# Patient Record
Sex: Female | Born: 1970
Health system: Southern US, Community
[De-identification: ages and names within clinical notes are randomized; demographics above are authoritative.]

## PROBLEM LIST (undated history)

## (undated) DIAGNOSIS — G43909 Migraine, unspecified, not intractable, without status migrainosus: Secondary | ICD-10-CM

## (undated) DIAGNOSIS — L409 Psoriasis, unspecified: Secondary | ICD-10-CM

## (undated) DIAGNOSIS — N189 Chronic kidney disease, unspecified: Secondary | ICD-10-CM

## (undated) DIAGNOSIS — E785 Hyperlipidemia, unspecified: Secondary | ICD-10-CM

## (undated) HISTORY — DX: Psoriasis, unspecified: L40.9

## (undated) HISTORY — DX: Migraine, unspecified, not intractable, without status migrainosus: G43.909

## (undated) HISTORY — DX: Hyperlipidemia, unspecified: E78.5

## (undated) HISTORY — DX: Chronic kidney disease, unspecified: N18.9

---

## 1989-05-29 HISTORY — PX: PLACEMENT OF BREAST IMPLANTS: SHX6334

## 1998-05-29 HISTORY — PX: OTHER SURGICAL HISTORY: SHX169

## 1998-08-11 ENCOUNTER — Inpatient Hospital Stay (HOSPITAL_COMMUNITY): Admission: AD | Admit: 1998-08-11 | Discharge: 1998-08-11 | Payer: Self-pay | Admitting: Obstetrics and Gynecology

## 1998-08-20 ENCOUNTER — Other Ambulatory Visit: Admission: RE | Admit: 1998-08-20 | Discharge: 1998-08-20 | Payer: Self-pay | Admitting: Obstetrics and Gynecology

## 1998-11-10 ENCOUNTER — Encounter: Payer: Self-pay | Admitting: Obstetrics and Gynecology

## 1998-11-10 ENCOUNTER — Ambulatory Visit (HOSPITAL_COMMUNITY): Admission: RE | Admit: 1998-11-10 | Discharge: 1998-11-10 | Payer: Self-pay | Admitting: Obstetrics and Gynecology

## 1998-12-07 ENCOUNTER — Inpatient Hospital Stay (HOSPITAL_COMMUNITY): Admission: AD | Admit: 1998-12-07 | Discharge: 1998-12-07 | Payer: Self-pay | Admitting: Obstetrics and Gynecology

## 1999-02-26 ENCOUNTER — Inpatient Hospital Stay (HOSPITAL_COMMUNITY): Admission: AD | Admit: 1999-02-26 | Discharge: 1999-03-01 | Payer: Self-pay | Admitting: Obstetrics and Gynecology

## 1999-03-03 ENCOUNTER — Encounter (HOSPITAL_COMMUNITY): Admission: RE | Admit: 1999-03-03 | Discharge: 1999-06-01 | Payer: Self-pay | Admitting: Obstetrics and Gynecology

## 2002-01-10 ENCOUNTER — Encounter: Payer: Self-pay | Admitting: Nephrology

## 2002-01-10 ENCOUNTER — Encounter: Admission: RE | Admit: 2002-01-10 | Discharge: 2002-01-10 | Payer: Self-pay | Admitting: Nephrology

## 2002-04-01 ENCOUNTER — Ambulatory Visit (HOSPITAL_COMMUNITY): Admission: RE | Admit: 2002-04-01 | Discharge: 2002-04-01 | Payer: Self-pay | Admitting: Neurology

## 2002-04-01 ENCOUNTER — Encounter: Payer: Self-pay | Admitting: Neurology

## 2003-02-11 ENCOUNTER — Other Ambulatory Visit: Admission: RE | Admit: 2003-02-11 | Discharge: 2003-02-11 | Payer: Self-pay | Admitting: Obstetrics and Gynecology

## 2003-08-18 ENCOUNTER — Inpatient Hospital Stay (HOSPITAL_COMMUNITY): Admission: AD | Admit: 2003-08-18 | Discharge: 2003-08-22 | Payer: Self-pay | Admitting: Obstetrics and Gynecology

## 2003-08-23 ENCOUNTER — Encounter: Admission: RE | Admit: 2003-08-23 | Discharge: 2003-09-22 | Payer: Self-pay | Admitting: Obstetrics and Gynecology

## 2003-09-23 ENCOUNTER — Encounter: Admission: RE | Admit: 2003-09-23 | Discharge: 2003-10-23 | Payer: Self-pay | Admitting: Obstetrics and Gynecology

## 2003-10-07 ENCOUNTER — Other Ambulatory Visit: Admission: RE | Admit: 2003-10-07 | Discharge: 2003-10-07 | Payer: Self-pay | Admitting: Obstetrics and Gynecology

## 2011-02-04 ENCOUNTER — Inpatient Hospital Stay (INDEPENDENT_AMBULATORY_CARE_PROVIDER_SITE_OTHER)
Admission: RE | Admit: 2011-02-04 | Discharge: 2011-02-04 | Disposition: A | Discharge status: Home / Self Care | Payer: Commercial Managed Care - PPO | Source: Ambulatory Visit | Attending: Emergency Medicine | Admitting: Emergency Medicine

## 2011-02-04 ENCOUNTER — Encounter: Payer: Self-pay | Admitting: Emergency Medicine

## 2011-02-04 DIAGNOSIS — N390 Urinary tract infection, site not specified: Secondary | ICD-10-CM

## 2011-02-04 LAB — CONVERTED CEMR LAB
Bilirubin Urine: NEGATIVE
Glucose, Urine, Semiquant: NEGATIVE
Ketones, urine, test strip: NEGATIVE
Nitrite: NEGATIVE
Protein, U semiquant: NEGATIVE
Specific Gravity, Urine: <1.005
Urobilinogen, UA: 0.2
pH: 5.5

## 2011-02-07 ENCOUNTER — Telehealth (INDEPENDENT_AMBULATORY_CARE_PROVIDER_SITE_OTHER): Payer: Self-pay | Admitting: *Deleted

## 2011-05-01 NOTE — Progress Notes (Signed)
Summary: uti?/tm(RM4)   Vital Signs:  Patient Profile:   40 Years Old Female CC:      ? UTI Height:     67 inches Weight:      125.50 pounds O2 Sat:      100 % O2 treatment:    Room Air Temp:     98.6 degrees F oral Pulse rate:   79 / minute Resp:     18 per minute BP sitting:   122 / 86  (left arm) Cuff size:   regular  Vitals Entered By: Linton Flemings RN (February 04, 2011 9:51 AM)                  Updated Prior Medication List: No Medications Current Allergies: ! CODEINE PHOSPHATE (CODEINE PHOSPHATE)History of Present Illness Chief Complaint: ? UTI History of Present Illness: 40 Years Old Female complains of UTI symptoms for 1 days.  She describes the pain as burning during urination.  She has used Azo which is helping. + dysuria + frequency + urgency + hematuria No vaginal discharge No fever/chills No lower abdomenal pain No back pain No fatigue   REVIEW OF SYSTEMS Constitutional Symptoms       Complains of fever.     Denies chills, night sweats, weight loss, weight gain, and fatigue.  Eyes       Denies change in vision, eye pain, eye discharge, glasses, contact lenses, and eye surgery. Ear/Nose/Throat/Mouth       Denies hearing loss/aids, change in hearing, ear pain, ear discharge, dizziness, frequent runny nose, frequent nose bleeds, sinus problems, sore throat, hoarseness, and tooth pain or bleeding.  Respiratory       Denies dry cough, productive cough, wheezing, shortness of breath, asthma, bronchitis, and emphysema/COPD.  Cardiovascular       Denies murmurs, chest pain, and tires easily with exhertion.    Gastrointestinal       Denies stomach pain, nausea/vomiting, diarrhea, constipation, blood in bowel movements, and indigestion. Genitourniary       Complains of painful urination.      Denies kidney stones and loss of urinary control. Neurological       Denies paralysis, seizures, and fainting/blackouts. Musculoskeletal       Denies muscle  pain, joint pain, joint stiffness, decreased range of motion, redness, swelling, muscle weakness, and gout.  Skin       Denies bruising, unusual mles/lumps or sores, and hair/skin or nail changes.  Psych       Denies mood changes, temper/anger issues, anxiety/stress, speech problems, depression, and sleep problems. Other Comments: STARTED THIS MORNING   Past History:  Past Medical History: MIGRAINES  Social History: SMOKE-NO ALCOHOL-YES REC. DRUGS-NO Physical Exam General appearance: well developed, well nourished, no acute distress Abdomen: soft, non-tender without obvious organomegaly Back: no cva or flank tenderness MSE: oriented to time, place, and person Assessment New Problems: URINARY TRACT INFECTION (ICD-599.0)   Patient Education: Patient and/or caregiver instructed in the following: rest, fluids.  Plan New Medications/Changes: BACTRIM DS 800-160 MG TABS (SULFAMETHOXAZOLE-TRIMETHOPRIM) 1 by mouth two times a day for 5 days  #10 x 0, 02/04/2011, Hoyt Koch MD  New Orders: New Patient Level III [phone] T-Culture, Urine [[id number]] UA Dipstick w/o Micro (automated)  [81003] Planning Comments:   Urine culture pending Follow-up with your primary care physician if not improving or if getting worse   The patient and/or caregiver has been counseled thoroughly with regard to medications prescribed including dosage, schedule, interactions, rationale  for use, and possible side effects and they verbalize understanding.  Diagnoses and expected course of recovery discussed and will return if not improved as expected or if the condition worsens. Patient and/or caregiver verbalized understanding.  Prescriptions: BACTRIM DS 800-160 MG TABS (SULFAMETHOXAZOLE-TRIMETHOPRIM) 1 by mouth two times a day for 5 days  #10 x 0   Entered and Authorized by:   Hoyt Koch MD   Signed by:   Hoyt Koch MD on 02/04/2011   Method used:   Print then Give to Patient    RxID:   [id number]   Orders Added: 1)  New Patient Level III [14782] 2)  T-Culture, Urine [[id number]] 3)  UA Dipstick w/o Micro (automated)  [81003]    Laboratory Results   Urine Tests  Date/Time Received: February 04, 2011 10:07 AM  Date/Time Reported: February 04, 2011 10:07 AM   Routine Urinalysis   Color: lt. yellow Appearance: Clear Glucose: negative   (Normal Range: Negative) Bilirubin: negative   (Normal Range: Negative) Ketone: negative   (Normal Range: Negative) Spec. Gravity: <1.005   (Normal Range: 1.003-1.035) Blood: 3+   (Normal Range: Negative) pH: 5.5   (Normal Range: 5.0-8.0) Protein: negative   (Normal Range: Negative) Urobilinogen: 0.2   (Normal Range: 0-1) Nitrite: negative   (Normal Range: Negative) Leukocyte Esterace: 1+   (Normal Range: Negative)

## 2011-05-01 NOTE — Telephone Encounter (Signed)
  Phone Note Outgoing Call   Call placed by: Clemens Catholic LPN,  February 07, 2011 6:00 PM Summary of Call: call back: left message with culture results and to call back if she has any questions or concerns. Initial call taken by: Clemens Catholic LPN,  February 07, 2011 6:00 PM

## 2013-04-11 ENCOUNTER — Other Ambulatory Visit: Payer: Self-pay | Admitting: Obstetrics and Gynecology

## 2013-06-09 ENCOUNTER — Other Ambulatory Visit: Payer: Self-pay | Admitting: Obstetrics and Gynecology

## 2013-06-09 DIAGNOSIS — R928 Other abnormal and inconclusive findings on diagnostic imaging of breast: Secondary | ICD-10-CM

## 2013-06-17 ENCOUNTER — Ambulatory Visit
Admission: RE | Admit: 2013-06-17 | Discharge: 2013-06-17 | Disposition: A | Discharge status: Home / Self Care | Payer: 59 | Source: Ambulatory Visit | Attending: Obstetrics and Gynecology | Admitting: Obstetrics and Gynecology

## 2013-06-17 DIAGNOSIS — R928 Other abnormal and inconclusive findings on diagnostic imaging of breast: Secondary | ICD-10-CM

## 2014-11-05 IMAGING — MG MM DIANOSTIC UNILATERAL R
1 series · 1 of 1 positions shown · non-contrast
Comparison: Previous exams.

CLINICAL DATA: Screening recall for a possible right breast mass.

EXAM:
DIGITAL DIAGNOSTIC  RIGHT MAMMOGRAM
ULTRASOUND RIGHT BREAST

[R CC]
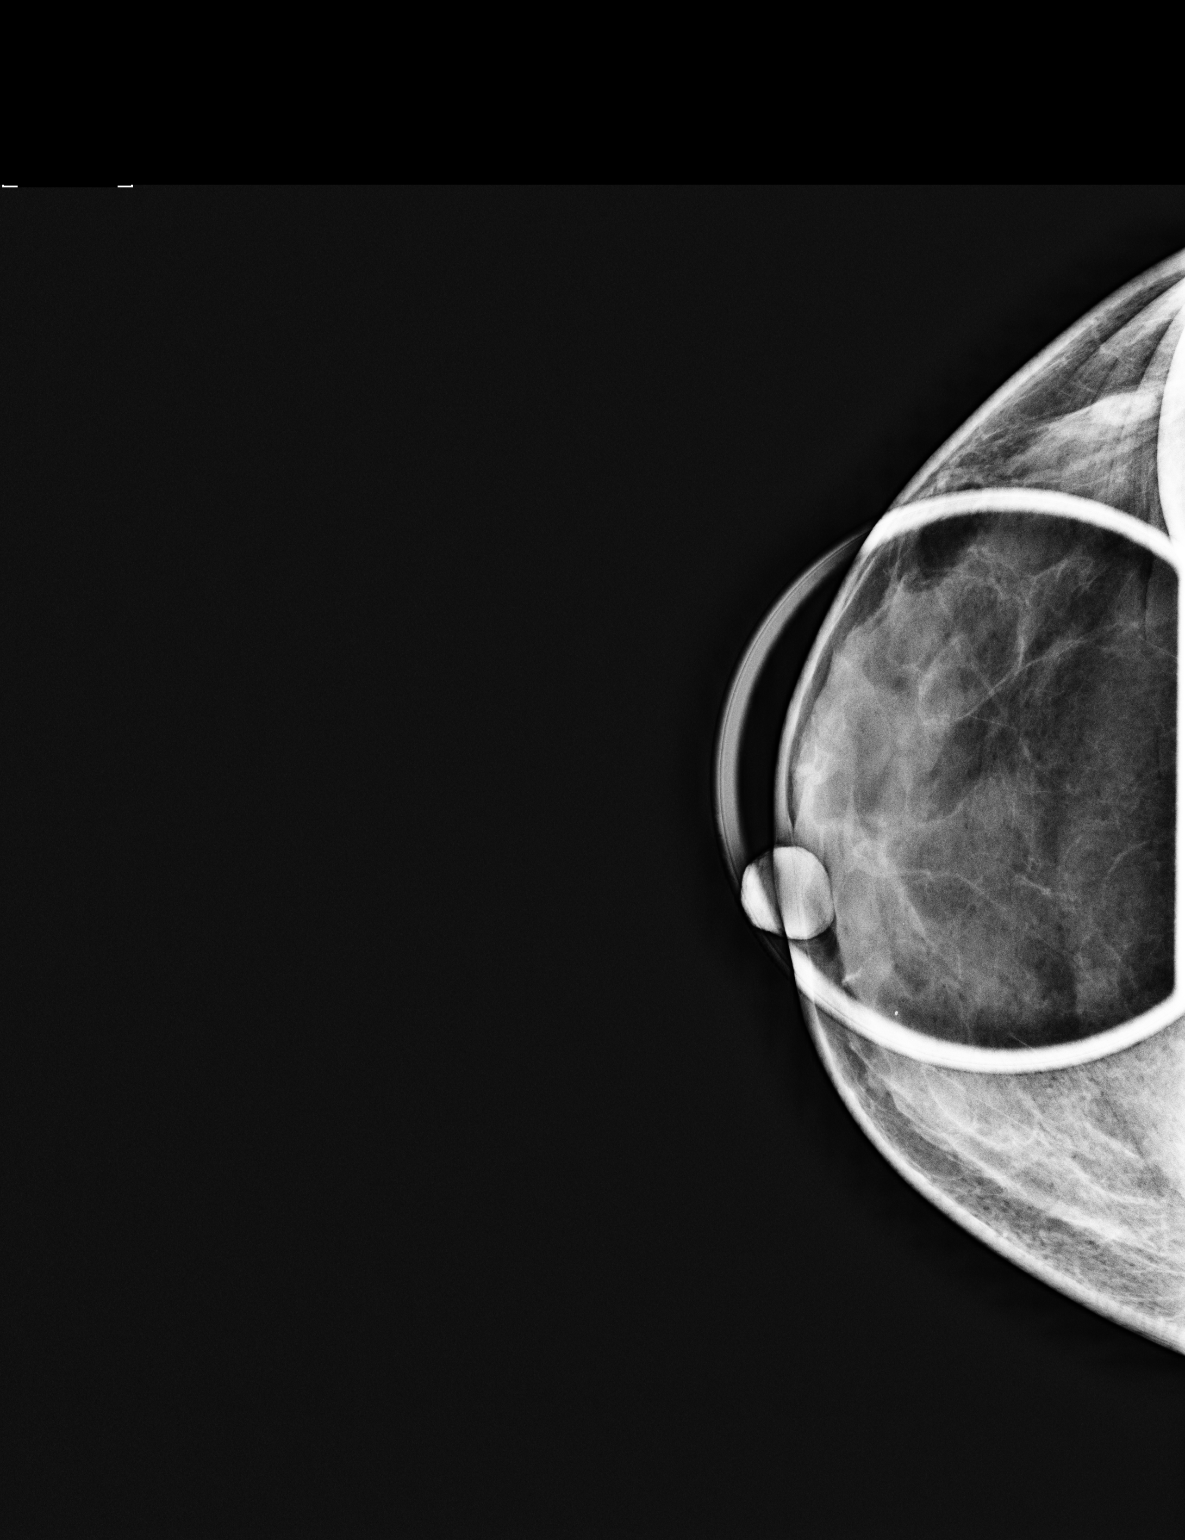

[1 of 1 positions shown; findings below may reference images not displayed]

ACR Breast Density Category c: The breast tissue is heterogeneously
dense, which may obscure small masses.
FINDINGS: The initially questioned possible mass in the periareolar/slightly
lateral right breast is no longer apparent on the additional implant
displaced spot compression CC and MLO views.

Physical examination of the periareolar/slightly lateral right
breast does not reveal any palpable masses.

Targeted ultrasound of the right breast was performed. No discrete
masses or abnormalities are seen, only normal appearing
fibroglandular tissue is visualized. The entire central and slightly
outer right breast was scanned.
IMPRESSION: Initially questioned possible right breast mass resolves on the
additional imaging with no sonographic correlate seen. Findings are
consistent with superimposed breast tissue. There is no mammographic
evidence of malignancy in the right breast.

RECOMMENDATION:
Screening mammogram in one year.(Code:Y3-1-43B)

I have discussed the findings and recommendations with the patient.
Results were also provided in writing at the conclusion of the
visit. If applicable, a reminder letter will be sent to the patient
regarding the next appointment.

BI-RADS CATEGORY  1: Negative

## 2016-12-14 ENCOUNTER — Ambulatory Visit (INDEPENDENT_AMBULATORY_CARE_PROVIDER_SITE_OTHER): Payer: 59 | Admitting: Family Medicine

## 2016-12-14 ENCOUNTER — Encounter: Payer: Self-pay | Admitting: Family Medicine

## 2016-12-14 VITALS — BP 133/89 | HR 93 | Ht 68.0 in | Wt 134.0 lb

## 2016-12-14 DIAGNOSIS — R1900 Intra-abdominal and pelvic swelling, mass and lump, unspecified site: Secondary | ICD-10-CM

## 2016-12-14 DIAGNOSIS — R110 Nausea: Secondary | ICD-10-CM | POA: Diagnosis not present

## 2016-12-14 DIAGNOSIS — G8929 Other chronic pain: Secondary | ICD-10-CM | POA: Diagnosis not present

## 2016-12-14 DIAGNOSIS — M545 Low back pain, unspecified: Secondary | ICD-10-CM | POA: Insufficient documentation

## 2016-12-14 NOTE — Assessment & Plan Note (Signed)
consistent with SI joint dysfunction.  Encouraged home exercises which were reviewed today.  Consider manipulation, injection in addition to therapy.  F/u prn for this issue.

## 2016-12-14 NOTE — Progress Notes (Addendum)
PCP: Patient, No Pcp Per  Subjective:   HPI: Patient is a 46 y.o. female here for nausea, abdominal pain.  Patient reports she's noticed intermittent nausea for about 2 years. Does not seem to be associated with abdominal pain, around eating or drinking. Happens almost every day. No diarrhea, constipation, melena, hematochezia, vomiting, heartburn, fever, chills, sweats, chest pain, shortness of breath. No bloating. Has had right sided low back pain for at least 10-15 years. Worse lying down, if trying to exercise. No radiation of pain. Also noticed recently strong pulsation in left side of abdomen. Unsure how long this has been there. Concerned because of strong FH of cardiac disease - mother had MIs in her 4s and men in family all tended to pass by their 84s of heart disease. She has no history of HTN, DM, HLD that she is aware of.  No past medical history on file.  No current outpatient prescriptions on file prior to visit.   No current facility-administered medications on file prior to visit.     No past surgical history on file.  Allergies  Allergen Reactions  . Codeine Phosphate     Social History   Social History  . Marital status: Single    Spouse name: N/A  . Number of children: N/A  . Years of education: N/A   Occupational History  . Not on file.   Social History Main Topics  . Smoking status: Never Smoker  . Smokeless tobacco: Never Used  . Alcohol use Not on file  . Drug use: Unknown  . Sexual activity: Not on file   Other Topics Concern  . Not on file   Social History Narrative  . No narrative on file    No family history on file.  BP 133/89   Pulse 93   Ht 5\' 8"  (1.727 m)   Wt 134 lb (60.8 kg)   BMI 20.37 kg/m   Review of Systems: See HPI above.     Objective:  Physical Exam:  Gen: NAD, comfortable in exam room, thin.  CV: RRR no MRG, strong radial pulses Lungs: CTAB without wheezes, rales, rhonchi. Abd: Pulsation noted  throughout abdomen but more prominent on left.  Soft, mild diffuse tenderness.  Nondistended.  No HSM and no palpable masses.    Back: No gross deformity, scoliosis. No TTP including SI joints.  No midline or bony TTP. FROM. Strength LEs 5/5 all muscle groups.   Negative SLRs. Sensation intact to light touch bilaterally. Negative logroll bilateral hips Positive right fabers with tightness - also tight on left but pain right SI on testing.  Negative piriformis.  Abd u/s:  Visualized abdominal aorta appears normal from just distal to ribcage to level of ASIS.  Largest diameter 1.5cm.  No evidence aneurysm.  Noted to be only about 2cm deep from abdominal wall.  Assessment & Plan:  1. Pulsatile mass - Abdominal aorta appears normal, reassured patient.  Family history would be her only risk factor known.  Likely appears more prominent because she is thin.  She has not had bloodwork in a long time - recommended fasting labs here (CMP, CBC with diff, lipids, lipase).  Of note she has also had intermittent nausea for about 2 years - we discussed consideration of CT abd/pelvis without contrast, RUQ u/s.  She will think about these in addition to above labwork - rest of ROS otherwise normal.  2. Low back pain - consistent with SI joint dysfunction.  Encouraged home exercises which  were reviewed today.  Consider manipulation, injection in addition to therapy.  F/u prn for this issue.  Addendum:  Lab results reviewed and discussed with patient - no abnormalities noted.  Her cholesterol is 210, LDL 140, HDL 52.  We discussed some basic dietary measures, encouraged exercise.  She could speak with her OB/Gyn on if she prescribes statins but based on her 10 year risk of CVD/CAD (1.3%) she does not meet indications for statin therapy.

## 2016-12-14 NOTE — Assessment & Plan Note (Signed)
Abdominal aorta appears normal, reassured patient.  Family history would be her only risk factor known.  Likely appears more prominent because she is thin.  She has not had bloodwork in a long time - recommended fasting labs here (CMP, CBC with diff, lipids, lipase).  Of note she has also had intermittent nausea for about 2 years - we discussed consideration of CT abd/pelvis without contrast, RUQ u/s.  She will think about these in addition to above labwork - rest of ROS otherwise normal.

## 2016-12-14 NOTE — Patient Instructions (Signed)
Your abdominal aorta is normal by ultrasound - I see no evidence of aneurysm or other abnormalities. Return in the morning fasting any weekday - I will put in CMP, CBC with diff, lipids, lipase to assess for other risk factors or things that would show potential reason for the nausea. Consider CT of the abdomen and pelvis with only oral contrast (not IV) to evaluate the persistent nausea, potential referred pain into the back - you'd just let me know if you want to pursue this. For the SI joint I would recommend regular stretches (holding 20-30 seconds, repeating 3 times), hip strengthening exercises. Injection is an option but data is mixed on how much this would help you. Manipulation (chiropractic or osteopathic) may be beneficial for this if other measures aren't working. Follow up with me as needed otherwise.

## 2016-12-19 ENCOUNTER — Other Ambulatory Visit: Payer: Self-pay | Admitting: Family Medicine

## 2016-12-19 DIAGNOSIS — R110 Nausea: Secondary | ICD-10-CM | POA: Diagnosis not present

## 2016-12-20 LAB — CBC WITH DIFFERENTIAL/PLATELET
Basophils Absolute: 0 10*3/uL (ref 0.0–0.2)
Basos: 0 %
EOS (ABSOLUTE): 0.1 10*3/uL (ref 0.0–0.4)
Eos: 1 %
HEMATOCRIT: 39.5 % (ref 34.0–46.6)
HEMOGLOBIN: 13.6 g/dL (ref 11.1–15.9)
IMMATURE GRANS (ABS): 0 10*3/uL (ref 0.0–0.1)
IMMATURE GRANULOCYTES: 0 %
LYMPHS: 19 %
Lymphocytes Absolute: 1.6 10*3/uL (ref 0.7–3.1)
MCH: 30.8 pg (ref 26.6–33.0)
MCHC: 34.4 g/dL (ref 31.5–35.7)
MCV: 89 fL (ref 79–97)
MONOCYTES: 5 %
Monocytes Absolute: 0.4 10*3/uL (ref 0.1–0.9)
NEUTROS PCT: 75 %
Neutrophils Absolute: 6 10*3/uL (ref 1.4–7.0)
Platelets: 239 10*3/uL (ref 150–379)
RBC: 4.42 x10E6/uL (ref 3.77–5.28)
RDW: 13.7 % (ref 12.3–15.4)
WBC: 8.1 10*3/uL (ref 3.4–10.8)

## 2016-12-20 LAB — COMPREHENSIVE METABOLIC PANEL
ALK PHOS: 59 IU/L (ref 39–117)
ALT: 9 IU/L (ref 0–32)
AST: 15 IU/L (ref 0–40)
Albumin/Globulin Ratio: 1.6 (ref 1.2–2.2)
Albumin: 4.3 g/dL (ref 3.5–5.5)
BUN/Creatinine Ratio: 15 (ref 9–23)
BUN: 11 mg/dL (ref 6–24)
Bilirubin Total: 0.3 mg/dL (ref 0.0–1.2)
CO2: 26 mmol/L (ref 20–29)
Calcium: 9.6 mg/dL (ref 8.7–10.2)
Chloride: 104 mmol/L (ref 96–106)
Creatinine, Ser: 0.73 mg/dL (ref 0.57–1.00)
GFR calc Af Amer: 113 mL/min/{1.73_m2} (ref >59)
GFR calc non Af Amer: 98 mL/min/{1.73_m2} (ref >59)
Globulin, Total: 2.7 g/dL (ref 1.5–4.5)
Glucose: 94 mg/dL (ref 65–99)
Potassium: 5.2 mmol/L (ref 3.5–5.2)
Sodium: 144 mmol/L (ref 134–144)
Total Protein: 7 g/dL (ref 6.0–8.5)

## 2016-12-20 LAB — LIPID PANEL W/O CHOL/HDL RATIO
Cholesterol, Total: 210 mg/dL — ABNORMAL HIGH (ref 100–199)
HDL: 52 mg/dL (ref >39)
LDL Calculated: 140 mg/dL — ABNORMAL HIGH (ref 0–99)
Triglycerides: 88 mg/dL (ref 0–149)
VLDL Cholesterol Cal: 18 mg/dL (ref 5–40)

## 2016-12-20 LAB — LIPASE: LIPASE: 26 U/L (ref 14–72)

## 2017-02-20 DIAGNOSIS — Z01419 Encounter for gynecological examination (general) (routine) without abnormal findings: Secondary | ICD-10-CM | POA: Diagnosis not present

## 2017-02-20 DIAGNOSIS — Z6820 Body mass index (BMI) 20.0-20.9, adult: Secondary | ICD-10-CM | POA: Diagnosis not present

## 2017-02-27 DIAGNOSIS — Z1231 Encounter for screening mammogram for malignant neoplasm of breast: Secondary | ICD-10-CM | POA: Diagnosis not present

## 2018-07-19 DIAGNOSIS — L400 Psoriasis vulgaris: Secondary | ICD-10-CM | POA: Diagnosis not present

## 2018-07-19 MED FILL — CLOBETASOL PROP 0.05% FOAM: 0.05 | 30 days supply | Qty: 100 | Fill #0

## 2018-07-19 MED FILL — FLUOCINOLONE ACETONIDE SCAL: 0.01 | 30 days supply | Qty: 118 | Fill #0

## 2018-11-19 DIAGNOSIS — N912 Amenorrhea, unspecified: Secondary | ICD-10-CM | POA: Diagnosis not present

## 2018-11-19 DIAGNOSIS — N959 Unspecified menopausal and perimenopausal disorder: Secondary | ICD-10-CM | POA: Diagnosis not present

## 2018-11-19 DIAGNOSIS — Z6821 Body mass index (BMI) 21.0-21.9, adult: Secondary | ICD-10-CM | POA: Diagnosis not present

## 2018-11-19 DIAGNOSIS — Z01419 Encounter for gynecological examination (general) (routine) without abnormal findings: Secondary | ICD-10-CM | POA: Diagnosis not present

## 2018-12-17 DIAGNOSIS — Z1231 Encounter for screening mammogram for malignant neoplasm of breast: Secondary | ICD-10-CM | POA: Diagnosis not present

## 2018-12-24 DIAGNOSIS — Z30433 Encounter for removal and reinsertion of intrauterine contraceptive device: Secondary | ICD-10-CM | POA: Diagnosis not present

## 2019-02-10 DIAGNOSIS — Z30431 Encounter for routine checking of intrauterine contraceptive device: Secondary | ICD-10-CM | POA: Diagnosis not present

## 2019-03-12 MED FILL — FLUARIX QUADRIVALENT 0.5 ML: 0.5 | 1 days supply | Qty: 1 | Fill #0

## 2019-03-25 MED FILL — FLUOCINOLONE ACETONIDE SCAL: 0.01 | 30 days supply | Qty: 118 | Fill #1

## 2019-03-25 MED FILL — CLOBETASOL PROP 0.05% FOAM: 0.05 | 30 days supply | Qty: 100 | Fill #1

## 2019-04-22 MED FILL — NITROFURANTOIN MONO-MCR 100: 100 | 7 days supply | Qty: 14 | Fill #0

## 2019-11-13 ENCOUNTER — Other Ambulatory Visit: Payer: Self-pay

## 2019-11-14 ENCOUNTER — Ambulatory Visit (INDEPENDENT_AMBULATORY_CARE_PROVIDER_SITE_OTHER): Payer: 59 | Admitting: Family Medicine

## 2019-11-14 ENCOUNTER — Encounter: Payer: Self-pay | Admitting: Family Medicine

## 2019-11-14 VITALS — BP 110/88 | HR 90 | Temp 97.6°F | Ht 67.25 in | Wt 136.0 lb

## 2019-11-14 DIAGNOSIS — H543 Unqualified visual loss, both eyes: Secondary | ICD-10-CM | POA: Diagnosis not present

## 2019-11-14 DIAGNOSIS — F411 Generalized anxiety disorder: Secondary | ICD-10-CM | POA: Diagnosis not present

## 2019-11-14 DIAGNOSIS — Z0000 Encounter for general adult medical examination without abnormal findings: Secondary | ICD-10-CM

## 2019-11-14 DIAGNOSIS — Z1211 Encounter for screening for malignant neoplasm of colon: Secondary | ICD-10-CM

## 2019-11-14 LAB — BASIC METABOLIC PANEL
BUN: 15 mg/dL (ref 6–23)
CO2: 26 mEq/L (ref 19–32)
Calcium: 9.7 mg/dL (ref 8.4–10.5)
Chloride: 107 mEq/L (ref 96–112)
Creatinine, Ser: 0.77 mg/dL (ref 0.40–1.20)
GFR: 78.97 mL/min (ref >60.00)
Glucose, Bld: 99 mg/dL (ref 70–99)
Potassium: 4.8 mEq/L (ref 3.5–5.1)
Sodium: 139 mEq/L (ref 135–145)

## 2019-11-14 LAB — CBC
HCT: 40.9 % (ref 36.0–46.0)
Hemoglobin: 13.5 g/dL (ref 12.0–15.0)
MCHC: 33 g/dL (ref 30.0–36.0)
MCV: 92.8 fl (ref 78.0–100.0)
Platelets: 239 10*3/uL (ref 150.0–400.0)
RBC: 4.4 Mil/uL (ref 3.87–5.11)
RDW: 13.6 % (ref 11.5–15.5)
WBC: 9.1 10*3/uL (ref 4.0–10.5)

## 2019-11-14 LAB — LIPID PANEL
Cholesterol: 247 mg/dL — ABNORMAL HIGH (ref 0–200)
HDL: 54.6 mg/dL (ref >39.00)
LDL Cholesterol: 177 mg/dL — ABNORMAL HIGH (ref 0–99)
NonHDL: 192.69
Total CHOL/HDL Ratio: 5
Triglycerides: 80 mg/dL (ref 0.0–149.0)
VLDL: 16 mg/dL (ref 0.0–40.0)

## 2019-11-14 LAB — AST: AST: 11 U/L (ref 0–37)

## 2019-11-14 LAB — VITAMIN D 25 HYDROXY (VIT D DEFICIENCY, FRACTURES): VITD: 31.08 ng/mL (ref 30.00–100.00)

## 2019-11-14 LAB — ALT: ALT: 8 U/L (ref 0–35)

## 2019-11-14 MED ORDER — CLOBETASOL PROPIONATE 0.05 % EX FOAM: Freq: Two times a day (BID) | CUTANEOUS | 3 refills | Status: AC

## 2019-11-14 MED FILL — CLOBETASOL PROP 0.05% FOAM: 0.05 | 30 days supply | Qty: 100 | Fill #0

## 2019-11-14 NOTE — Addendum Note (Signed)
Addended by: Lynnea Ferrier on: 11/14/2019 03:48 PM   Modules accepted: Orders

## 2019-11-14 NOTE — Progress Notes (Signed)
Katelyn Weiss is a 49 y.o. female  Chief Complaint  Patient presents with  . Establish Care    Pt here for a physical.  Pt has a GYN and scheduled for a pap and mammogram.  Pt is due for a colonoscopy and need a referral.  Also for an eye exam.    HPI: Katelyn Weiss is a 49 y.o. female here as a new patient for CPE, fasting labs. She has appt next week with GYN Dr. Helane Rima.  2 sons, married (husband is PT at [employer])  Last PAP: will schedule with GYN Last mammo: will schedule with GYN Last colonoscopy: needs referral  Diet/Exercise: low red meat, increased veggies but whole milk and lots of sugar; walks but limited by knee and toe pain Eye: due and requests recommendation/referral Dental: due - has appt   Med refills needed today? olux foam  Pt notes a h/o anxiety and more recently panic attacks. These can occur when she is relaxed and more recently after sex once she has fallen asleep.  She does have a h/o "trauma" in her past.  She would like to see a therapist. No interested in meds at this time. She had Rx for xanax years ago - took 5 tabs over a few years   Past Medical History:  Diagnosis Date  . Migraines     Past Surgical History:  Procedure Laterality Date  . CESAREAN SECTION  2005  . natural child birth  35  . PLACEMENT OF BREAST IMPLANTS  1991    Social History   Socioeconomic History  . Marital status: Single    Spouse name: Not on file  . Number of children: Not on file  . Years of education: Not on file  . Highest education level: Not on file  Occupational History  . Not on file  Tobacco Use  . Smoking status: Never Smoker  . Smokeless tobacco: Never Used  Vaping Use  . Vaping Use: Never used  Substance and Sexual Activity  . Alcohol use: Yes  . Drug use: Never  . Sexual activity: Not on file  Other Topics Concern  . Not on file  Social History Narrative  . Not on file   Social Determinants of Health   Financial Resource  Strain:   . Difficulty of Paying Living Expenses:   Food Insecurity:   . Worried About Charity fundraiser in the Last Year:   . Arboriculturist in the Last Year:   Transportation Needs:   . Film/video editor (Medical):   Marland Kitchen Lack of Transportation (Non-Medical):   Physical Activity:   . Days of Exercise per Week:   . Minutes of Exercise per Session:   Stress:   . Feeling of Stress :   Social Connections:   . Frequency of Communication with Friends and Family:   . Frequency of Social Gatherings with Friends and Family:   . Attends Religious Services:   . Active Member of Clubs or Organizations:   . Attends Archivist Meetings:   Marland Kitchen Marital Status:   Intimate Partner Violence:   . Fear of Current or Ex-Partner:   . Emotionally Abused:   Marland Kitchen Physically Abused:   . Sexually Abused:     Family History  Problem Relation Age of Onset  . Heart attack Mother   . Hypertension Father   . Hypercholesterolemia Father   . Breast cancer Paternal Grandmother   . Heart disease Paternal  Grandmother      Immunization History  Administered Date(s) Administered  . Influenza,inj,Quad PF,6+ Mos 03/12/2019    Outpatient Encounter Medications as of 11/14/2019  Medication Sig  . clobetasol (OLUX) 0.05 % topical foam Apply topically 2 (two) times daily.  . Fluocinolone Acetonide Body 0.01 % OIL fluocinolone 0.01 % scalp oil and shower cap  . influenza vac split quadrivalent PF (FLUARIX QUADRIVALENT) 0.5 ML injection Fluarix Quad 2020-2021 (PF) 60 mcg (15 mcg x 4)/0.5 mL IM syringe  . levonorgestrel (MIRENA, 52 MG,) 20 MCG/24HR IUD Mirena 20 mcg/24 hours (6 yrs) 52 mg intrauterine device  Take 1 device by intrauterine route.  . [DISCONTINUED] clobetasol (OLUX) 0.05 % topical foam clobetasol 0.05 % topical foam   No facility-administered encounter medications on file as of 11/14/2019.     ROS: Gen: no fever, chills  Skin: no rash, itching ENT: no ear pain, ear drainage, nasal  congestion, rhinorrhea, sinus pressure, sore throat Eyes: no blurry vision, double vision Resp: no cough, wheeze,SOB CV: no CP, palpitations, LE edema,  GI: no heartburn, n/v/d/c, abd pain GU: no dysuria, urgency, frequency, hematuria MSK: no joint pain, myalgias, back pain Neuro: no dizziness, headache, weakness, vertigo Psych: + anxiety    Allergies  Allergen Reactions  . Codeine   . Codeine Phosphate     BP 110/88 (BP Location: Left Arm, Patient Position: Sitting, Cuff Size: Normal)   Pulse 90   Temp 97.6 F (36.4 C) (Temporal)   Ht 5' 7.25" (1.708 m)   Wt 136 lb (61.7 kg)   SpO2 100%   BMI 21.14 kg/m    BP Readings from Last 3 Encounters:  11/14/19 110/88  12/14/16 133/89  02/04/11 122/86   Pulse Readings from Last 3 Encounters:  11/14/19 90  12/14/16 93  02/04/11 79    Physical Exam Exam conducted with a chaperone present.  Constitutional:      General: She is not in acute distress.    Appearance: She is well-developed.  HENT:     Head: Normocephalic and atraumatic.     Right Ear: Tympanic membrane and ear canal normal.     Left Ear: Tympanic membrane and ear canal normal.     Nose: Nose normal.  Eyes:     Conjunctiva/sclera: Conjunctivae normal.     Pupils: Pupils are equal, round, and reactive to light.  Neck:     Thyroid: No thyromegaly.  Cardiovascular:     Rate and Rhythm: Normal rate and regular rhythm.     Heart sounds: Normal heart sounds. No murmur heard.   Pulmonary:     Effort: Pulmonary effort is normal. No respiratory distress.     Breath sounds: Normal breath sounds. No wheezing or rhonchi.  Abdominal:     General: Bowel sounds are normal. There is no distension.     Palpations: Abdomen is soft. There is no mass.     Tenderness: There is no abdominal tenderness.  Genitourinary:    Labia:        Right: No rash, tenderness or lesion.        Left: No rash, tenderness or lesion.      Urethra: No prolapse or urethral swelling.      Vagina: Normal. No vaginal discharge or bleeding.     Cervix: Normal.     Uterus: Normal.      Adnexa: Right adnexa normal and left adnexa normal.  Musculoskeletal:     Cervical back: Neck supple.  Lymphadenopathy:  Cervical: No cervical adenopathy.  Skin:    General: Skin is warm and dry.  Neurological:     Mental Status: She is alert and oriented to person, place, and time.     Motor: No abnormal muscle tone.     Coordination: Coordination normal.  Psychiatric:        Behavior: Behavior normal.      A/P:  1. Annual physical exam - discussed importance of regular CV exercise, healthy diet, adequate sleep - due for eye appt, has dentist and will schedule - mammo and PAP with GYN, pt will schedule - colonoscopy referral placed today - ALT - AST - Basic metabolic panel - CBC - Lipid panel - VITAMIN D 25 Hydroxy (Vit-D Deficiency, Fractures) - Urinalysis - next CPE in 1 year  2. Screening for colon cancer - Ambulatory referral to Gastroenterology  3. Decreased vision in both eyes - Ambulatory referral to Ophthalmology  4. Generalized anxiety disorder - pt would like to establish with [facility] counselor, recommendations given and included in AVS - advised f/u if she would like to discuss or start med    This visit occurred during the SARS-CoV-2 public health emergency.  Safety protocols were in place, including screening questions prior to the visit, additional usage of staff PPE, and extensive cleaning of exam room while observing appropriate contact time as indicated for disinfecting solutions.

## 2019-11-14 NOTE — Patient Instructions (Addendum)
[place], [place], [place] [address] Phone: [phone]    [facility]: [email or web address]  [email or web address]  [email or web address] [email or web address]  [facility] [email or web address]  [email or web address]

## 2019-11-15 LAB — URINALYSIS
Bilirubin Urine: NEGATIVE
Glucose, UA: NEGATIVE
Hgb urine dipstick: NEGATIVE
Leukocytes,Ua: NEGATIVE
Nitrite: NEGATIVE
Protein, ur: NEGATIVE
Specific Gravity, Urine: 1.01 (ref 1.001–1.03)
pH: 6.5 (ref 5.0–8.0)

## 2019-11-17 ENCOUNTER — Encounter: Payer: Self-pay | Admitting: Gastroenterology

## 2020-01-01 ENCOUNTER — Encounter: Payer: 59 | Admitting: Gastroenterology

## 2020-01-21 ENCOUNTER — Other Ambulatory Visit: Payer: Self-pay

## 2020-01-21 ENCOUNTER — Ambulatory Visit (AMBULATORY_SURGERY_CENTER): Payer: Self-pay | Admitting: *Deleted

## 2020-01-21 ENCOUNTER — Encounter: Payer: Self-pay | Admitting: Gastroenterology

## 2020-01-21 VITALS — Ht 67.25 in | Wt 135.0 lb

## 2020-01-21 DIAGNOSIS — Z1211 Encounter for screening for malignant neoplasm of colon: Secondary | ICD-10-CM

## 2020-01-21 MED ORDER — NA SULFATE-K SULFATE-MG SULF 17.5-3.13-1.6 GM/177ML PO SOLN: 1.0000 | Freq: Once | ORAL | 0 refills | Status: AC

## 2020-01-21 NOTE — Progress Notes (Signed)

## 2020-01-27 MED FILL — SUPREP BOWEL PREP KIT: 17.5-3.13-1 | 1 days supply | Qty: 354 | Fill #0

## 2020-02-11 ENCOUNTER — Encounter: Payer: Self-pay | Admitting: Certified Registered Nurse Anesthetist

## 2020-02-12 ENCOUNTER — Other Ambulatory Visit: Payer: Self-pay

## 2020-02-12 ENCOUNTER — Encounter: Payer: Self-pay | Admitting: Gastroenterology

## 2020-02-12 ENCOUNTER — Ambulatory Visit (AMBULATORY_SURGERY_CENTER): Payer: 59 | Admitting: Gastroenterology

## 2020-02-12 VITALS — BP 134/99 | HR 73 | Temp 97.5°F | Resp 16 | Ht 67.25 in | Wt 135.0 lb

## 2020-02-12 DIAGNOSIS — K640 First degree hemorrhoids: Secondary | ICD-10-CM

## 2020-02-12 DIAGNOSIS — Z1211 Encounter for screening for malignant neoplasm of colon: Secondary | ICD-10-CM

## 2020-02-12 DIAGNOSIS — D124 Benign neoplasm of descending colon: Secondary | ICD-10-CM

## 2020-02-12 DIAGNOSIS — K635 Polyp of colon: Secondary | ICD-10-CM | POA: Diagnosis not present

## 2020-02-12 MED ORDER — SODIUM CHLORIDE 0.9 % IV SOLN: 500.0000 mL | Freq: Once | INTRAVENOUS | Status: DC

## 2020-02-12 NOTE — Patient Instructions (Signed)
YOU HAD AN ENDOSCOPIC PROCEDURE TODAY AT THE [facility]:   Refer to the procedure report that was given to you for any specific questions about what was found during the examination.  If the procedure report does not answer your questions, please call your gastroenterologist to clarify.  If you requested that your care partner not be given the details of your procedure findings, then the procedure report has been included in a sealed envelope for you to review at your convenience later.  YOU SHOULD EXPECT: Some feelings of bloating in the abdomen. Passage of more gas than usual.  Walking can help get rid of the air that was put into your GI tract during the procedure and reduce the bloating. If you had a lower endoscopy (such as a colonoscopy or flexible sigmoidoscopy) you may notice spotting of blood in your stool or on the toilet paper. If you underwent a bowel prep for your procedure, you may not have a normal bowel movement for a few days.  Please Note:  You might notice some irritation and congestion in your nose or some drainage.  This is from the oxygen used during your procedure.  There is no need for concern and it should clear up in a day or so.  SYMPTOMS TO REPORT IMMEDIATELY:   Following lower endoscopy (colonoscopy or flexible sigmoidoscopy):  Excessive amounts of blood in the stool  Significant tenderness or worsening of abdominal pains  Swelling of the abdomen that is new, acute  Fever of 100F or higher  For urgent or emergent issues, a gastroenterologist can be reached at any hour by calling [phone]. Do not use MyChart messaging for urgent concerns.    DIET:  We do recommend a small meal at first, but then you may proceed to your regular diet.  Drink plenty of fluids but you should avoid alcoholic beverages for 24 hours.  ACTIVITY:  You should plan to take it easy for the rest of today and you should NOT DRIVE or use heavy machinery until tomorrow (because  of the sedation medicines used during the test).    FOLLOW UP: Our staff will call the number listed on your records 48-72 hours following your procedure to check on you and address any questions or concerns that you may have regarding the information given to you following your procedure. If we do not reach you, we will leave a message.  We will attempt to reach you two times.  During this call, we will ask if you have developed any symptoms of COVID 19. If you develop any symptoms (ie: fever, flu-like symptoms, shortness of breath, cough etc.) before then, please call [phone].  If you test positive for Covid 19 in the 2 weeks post procedure, please call and report this information to us.    If any biopsies were taken you will be contacted by phone or by letter within the next 1-3 weeks.  Please call us at [phone] if you have not heard about the biopsies in 3 weeks.    SIGNATURES/CONFIDENTIALITY: You and/or your care partner have signed paperwork which will be entered into your electronic medical record.  These signatures attest to the fact that that the information above on your After Visit Summary has been reviewed and is understood.  Full responsibility of the confidentiality of this discharge information lies with you and/or your care-partner. 

## 2020-02-12 NOTE — Progress Notes (Signed)
Report given to PACU, vss 

## 2020-02-12 NOTE — Progress Notes (Signed)
Pt's states no medical or surgical changes since previsit or office visit. 

## 2020-02-12 NOTE — Op Note (Signed)
[facility] Patient Name: Amit Marquis Procedure Date: 02/12/2020 7:11 AM MRN: [id number] Endoscopist: Gerrit Heck , MD Age: 49 Referring MD:  Date of Birth: 10-Feb-1971 Gender: Female Account #: [id number] Procedure:                Colonoscopy Indications:              Screening for colorectal malignant neoplasm, This                            is the patient's first colonoscopy Medicines:                Monitored Anesthesia Care Procedure:                Pre-Anesthesia Assessment:                           - Prior to the procedure, a History and Physical                            was performed, and patient medications and                            allergies were reviewed. The patient's tolerance of                            previous anesthesia was also reviewed. The risks                            and benefits of the procedure and the sedation                            options and risks were discussed with the patient.                            All questions were answered, and informed consent                            was obtained. Prior Anticoagulants: The patient has                            taken no previous anticoagulant or antiplatelet                            agents. ASA Grade Assessment: II - A patient with                            mild systemic disease. After reviewing the risks                            and benefits, the patient was deemed in                            satisfactory condition to undergo the procedure.  After obtaining informed consent, the colonoscope                            was passed under direct vision. Throughout the                            procedure, the patient's blood pressure, pulse, and                            oxygen saturations were monitored continuously. The                            Colonoscope was introduced through the anus and                            advanced to the the  terminal ileum. The colonoscopy                            was performed without difficulty. The patient                            tolerated the procedure well. The quality of the                            bowel preparation was excellent. The terminal                            ileum, ileocecal valve, appendiceal orifice, and                            rectum were photographed. Scope In: 8:02:43 AM Scope Out: 8:22:31 AM Scope Withdrawal Time: 0 hours 14 minutes 13 seconds  Total Procedure Duration: 0 hours 19 minutes 48 seconds  Findings:                 The perianal and digital rectal examinations were                            normal.                           A 6 mm polyp was found in the descending colon. The                            polyp was sessile. The polyp was removed with a                            cold snare. Resection and retrieval were complete.                            Estimated blood loss was minimal.                           Non-bleeding internal hemorrhoids were found during  retroflexion. The hemorrhoids were small and Grade                            I (internal hemorrhoids that do not prolapse).                           The exam was otherwise normal throughout the                            remainder of the colon.                           The terminal ileum appeared normal. Complications:            No immediate complications. Estimated Blood Loss:     Estimated blood loss was minimal. Impression:               - One 6 mm polyp in the descending colon, removed                            with a cold snare. Resected and retrieved.                           - Non-bleeding internal hemorrhoids.                           - The examined portion of the ileum was normal. Recommendation:           - Patient has a contact number available for                            emergencies. The signs and symptoms of potential                             delayed complications were discussed with the                            patient. Return to normal activities tomorrow.                            Written discharge instructions were provided to the                            patient.                           - Resume previous diet.                           - Continue present medications.                           - Await pathology results.                           - Repeat colonoscopy in 5-10 years for surveillance  based on pathology results.                           - Return to GI office PRN. Gerrit Heck, MD 02/12/2020 8:27:41 AM

## 2020-02-12 NOTE — Progress Notes (Signed)
Called to room to assist during endoscopic procedure.  Patient ID and intended procedure confirmed with present staff. Received instructions for my participation in the procedure from the performing physician.  

## 2020-02-16 ENCOUNTER — Telehealth: Payer: Self-pay | Admitting: *Deleted

## 2020-02-16 NOTE — Telephone Encounter (Signed)
  Follow up Call-  Call back number 02/12/2020  Post procedure Call Back phone  # [phone]  Permission to leave phone message Yes  Some recent data might be hidden     Patient questions:  Message not left due to person on VM was not the same name.

## 2020-02-19 ENCOUNTER — Encounter: Payer: Self-pay | Admitting: Gastroenterology

## 2020-03-11 ENCOUNTER — Other Ambulatory Visit (HOSPITAL_COMMUNITY): Payer: Self-pay | Admitting: Internal Medicine

## 2020-03-11 MED FILL — FLUARIX QUADRIVALENT 0.5 ML: 0.5 | 1 days supply | Qty: 1 | Fill #0

## 2020-12-20 ENCOUNTER — Other Ambulatory Visit (HOSPITAL_COMMUNITY): Payer: Self-pay

## 2020-12-20 MED ORDER — NITROFURANTOIN MONOHYD MACRO 100 MG PO CAPS
100.0000 mg | ORAL_CAPSULE | Freq: Two times a day (BID) | ORAL | 0 refills | Status: AC
  Filled 2020-12-20: qty 14, 7d supply, fill #0

## 2020-12-21 ENCOUNTER — Other Ambulatory Visit (HOSPITAL_COMMUNITY): Payer: Self-pay

## 2021-02-18 ENCOUNTER — Ambulatory Visit: Payer: 59 | Attending: Internal Medicine

## 2021-02-18 DIAGNOSIS — Z23 Encounter for immunization: Secondary | ICD-10-CM

## 2021-02-18 NOTE — Progress Notes (Signed)
   Covid-19 Vaccination Clinic  Name:  Katelyn Weiss    MRN: [id number] DOB: 1970/12/28  02/18/2021  [name] was observed post Covid-19 immunization for 15 minutes without incident. She was provided with Vaccine Information Sheet and instruction to access the V-Safe system.   [name] was instructed to call 911 with any severe reactions post vaccine: Difficulty breathing  Swelling of face and throat  A fast heartbeat  A bad rash all over body  Dizziness and weakness

## 2021-02-25 ENCOUNTER — Other Ambulatory Visit (HOSPITAL_BASED_OUTPATIENT_CLINIC_OR_DEPARTMENT_OTHER): Payer: Self-pay

## 2021-02-25 MED ORDER — COVID-19MRNA BIVAL VACC PFIZER 30 MCG/0.3ML IM SUSP
INTRAMUSCULAR | 0 refills | Status: DC
  Filled 2021-02-25: qty 0.3, 1d supply, fill #0

## 2021-06-02 ENCOUNTER — Other Ambulatory Visit (HOSPITAL_COMMUNITY): Payer: Self-pay

## 2021-06-02 DIAGNOSIS — D2261 Melanocytic nevi of right upper limb, including shoulder: Secondary | ICD-10-CM | POA: Diagnosis not present

## 2021-06-02 DIAGNOSIS — L400 Psoriasis vulgaris: Secondary | ICD-10-CM | POA: Diagnosis not present

## 2021-06-02 MED ORDER — FLUOCINOLONE ACETONIDE SCALP 0.01 % EX OIL
1.0000 mL | TOPICAL_OIL | CUTANEOUS | 1 refills | Status: AC
  Filled 2021-06-02: qty 118.28, 30d supply, fill #0
  Filled 2022-05-29: qty 118.28, 30d supply, fill #1

## 2021-06-02 MED ORDER — CLOBETASOL PROPIONATE 0.05 % EX SOLN
CUTANEOUS | 3 refills | Status: AC
  Filled 2021-06-02: qty 50, 30d supply, fill #0
  Filled 2021-10-18: qty 50, 30d supply, fill #1
  Filled 2022-05-29: qty 50, 30d supply, fill #2

## 2021-10-18 ENCOUNTER — Other Ambulatory Visit (HOSPITAL_COMMUNITY): Payer: Self-pay

## 2021-12-26 ENCOUNTER — Encounter: Payer: Self-pay | Admitting: Family Medicine

## 2021-12-26 ENCOUNTER — Ambulatory Visit: Payer: 59 | Admitting: Family Medicine

## 2021-12-26 ENCOUNTER — Other Ambulatory Visit (HOSPITAL_COMMUNITY): Payer: Self-pay

## 2021-12-26 VITALS — BP 120/66 | HR 79 | Temp 98.2°F | Ht 68.0 in | Wt 137.2 lb

## 2021-12-26 DIAGNOSIS — F411 Generalized anxiety disorder: Secondary | ICD-10-CM | POA: Diagnosis not present

## 2021-12-26 DIAGNOSIS — Z8619 Personal history of other infectious and parasitic diseases: Secondary | ICD-10-CM | POA: Insufficient documentation

## 2021-12-26 DIAGNOSIS — Z23 Encounter for immunization: Secondary | ICD-10-CM

## 2021-12-26 DIAGNOSIS — R232 Flushing: Secondary | ICD-10-CM | POA: Diagnosis not present

## 2021-12-26 DIAGNOSIS — Z13220 Encounter for screening for lipoid disorders: Secondary | ICD-10-CM | POA: Diagnosis not present

## 2021-12-26 DIAGNOSIS — Z1159 Encounter for screening for other viral diseases: Secondary | ICD-10-CM | POA: Diagnosis not present

## 2021-12-26 DIAGNOSIS — F410 Panic disorder [episodic paroxysmal anxiety] without agoraphobia: Secondary | ICD-10-CM | POA: Diagnosis not present

## 2021-12-26 DIAGNOSIS — L409 Psoriasis, unspecified: Secondary | ICD-10-CM | POA: Diagnosis not present

## 2021-12-26 MED ORDER — VENLAFAXINE HCL ER 37.5 MG PO CP24
37.5000 mg | ORAL_CAPSULE | Freq: Every day | ORAL | 3 refills | Status: AC
  Filled 2021-12-26: qty 30, 30d supply, fill #0

## 2021-12-26 NOTE — Progress Notes (Signed)
[facility] [address] [place] [address] Dept: [phone] Dept Fax: [phone]  Transfer of Care Office Visit  Subjective:    Patient ID: Katelyn Weiss, female    DOB: [birth year], 51 y.o..   MRN: [id number]  Chief Complaint  Patient presents with   Establish Care    [facility]- establish care.  C/o having anxiety, hot flashes, not sleeping and ringing in ears.     History of Present Illness:  Patient is in today to establish care. Ms. Heumann was born in [place], [place]. She attended the [employer], where she received a degree in biology. She then attended [employer] and received a degree in cytology. She has been married for 29 years. She and her husband moved around a bit, so she has focused on being a homemaker. She has two children (22, 18). She denies tobacco or drug use. She drinks about 2 drinks per week.  Ms. Rasch has a history of scalp psoriasis. She manages this using a tanning oils to loosen the plaque and then applying a steroid solution to the scalp. She notes she would use this more often, but the cost of the steroid is prohibitive. She does note considerable itching at times. She denies any alopecia, but thinks her hair may be thinner or more fragile.  Ms. Southard notes she has had recent issues with hot flashes and not sleeping well. She currently has a Mirena IUD in place and has not had menses since this was placed.  Ms. Mcconahy notes a longer standing issue with anxiety and recurrent panic attacks. She has not been treated with medication for this. She finds her panic attacks can occur at night, waking her from sleep. She uses breathing techniques to help with this. She notes her son has considerable anxiety as well. He has been prescribed hydroxyzine for managing the panic issues.  Past Medical History: Patient Active Problem List   Diagnosis Date Noted   Psoriasis of scalp  12/26/2021   Generalized anxiety disorder with panic attacks 12/26/2021   Past Surgical History:  Procedure Laterality Date   CESAREAN SECTION  2005   PLACEMENT OF BREAST IMPLANTS  1991   Family History  Problem Relation Age of Onset   Heart attack Mother    Hypertension Father    Hypercholesterolemia Father    Colon polyps Father    Breast cancer Paternal Grandmother    Heart disease Paternal Grandmother    Colon polyps Paternal Grandmother    Colon polyps Paternal Uncle    Colon cancer Neg Hx    Esophageal cancer Neg Hx    Stomach cancer Neg Hx    Outpatient Medications Prior to Visit  Medication Sig Dispense Refill   clobetasol (OLUX) 0.05 % topical foam Apply topically 2 (two) times daily. 100 g 3   clobetasol (TEMOVATE) 0.05 % external solution Apply a small amount to scalp once a day 50 mL 3   Fluocinolone Acetonide Body 0.01 % OIL fluocinolone 0.01 % scalp oil and shower cap     Fluocinolone Acetonide Scalp 0.01 % OIL Apply 6m as directed 2-3 times a week as needed 118.28 mL 1   levonorgestrel (MIRENA, 52 MG,) 20 MCG/24HR IUD Mirena 20 mcg/24 hours (6 yrs) 52 mg intrauterine device  Take 1 device by intrauterine route.     COVID-19 mRNA bivalent vaccine, Pfizer, injection Inject into the muscle. 0.3 mL 0   No facility-administered medications prior to visit.  Allergies  Allergen Reactions   Ambien [Zolpidem]     Memory loss   Codeine      Objective:   Today's Vitals   12/26/21 0824  BP: 120/66  Pulse: 79  Temp: 98.2 F (36.8 C)  TempSrc: Temporal  SpO2: 98%  Weight: 137 lb 3.2 oz (62.2 kg)  Height: '5\' 8"'$  (1.727 m)   Body mass index is 20.86 kg/m.   General: Well developed, well nourished. No acute distress. Psych: Alert and oriented. Normal mood and affect.  Health Maintenance Due  Topic Date Due   HIV Screening  Never done   Hepatitis C Screening  Never done   TETANUS/TDAP  Never done   PAP SMEAR-Modifier  04/11/2016   MAMMOGRAM  08/31/2018    Zoster Vaccines- Shingrix (1 of 2) Never done        12/26/2021    9:18 AM  Depression screen PHQ 2/9  Decreased Interest 1  Down, Depressed, Hopeless 1  PHQ - 2 Score 2  Altered sleeping 3  Tired, decreased energy 1  Change in appetite 1  Feeling bad or failure about yourself  1  Trouble concentrating 1  Moving slowly or fidgety/restless 1  Suicidal thoughts 0  PHQ-9 Score 10  Difficult doing work/chores Somewhat difficult      12/26/2021    9:18 AM  GAD 7 : Generalized Anxiety Score  Nervous, Anxious, on Edge 3  Control/stop worrying 3  Worry too much - different things 3  Trouble relaxing 3  Restless 3  Easily annoyed or irritable 3  Afraid - awful might happen 3  Total GAD 7 Score 21  Anxiety Difficulty Very difficult   Assessment & Plan:   1. Generalized anxiety disorder with panic attacks Ms. Parada has a significantly high GAD-7 score. She describes symptoms consistent with generalized anxiety with an overlay of panic attacks. I will check some screening lab tests. I will refer her for counseling. I wills tart ehr on venlafaxine and plan to reassess her in 6 weeks.  - venlafaxine XR (EFFEXOR XR) 37.5 MG 24 hr capsule; Take 1 capsule (37.5 mg total) by mouth daily with breakfast.  Dispense: 30 capsule; Refill: 3 - TSH - T4, free - Comprehensive metabolic panel - CBC - Ambulatory referral to Psychology  2. Psoriasis of scalp Stable on current regimen of use of oils and topical steroids. Might consider dermatology referral to consider use of biologics for this if it remains less than optimally managed.  3. Screening for lipid disorders  - Lipid panel  4. Encounter for hepatitis C screening test for low risk patient  - HCV Ab w Reflex to Quant PCR  5. Hot flashes Symptoms could represent menopause. Will also screen for thyroid disease.  - TSH - T4, free - FSH/LH - Comprehensive metabolic panel - CBC  6. Need for shingles vaccine  -  Varicella-zoster vaccine IM  7. Need for Tdap vaccination  - Tdap vaccine greater than or equal to 7yo IM   Return in about 6 weeks (around 02/06/2022) for Reassessment.   Haydee Salter, MD

## 2021-12-27 ENCOUNTER — Encounter: Payer: Self-pay | Admitting: Family Medicine

## 2021-12-27 ENCOUNTER — Other Ambulatory Visit: Payer: 59

## 2021-12-27 DIAGNOSIS — R232 Flushing: Secondary | ICD-10-CM | POA: Diagnosis not present

## 2021-12-27 DIAGNOSIS — Z1159 Encounter for screening for other viral diseases: Secondary | ICD-10-CM | POA: Diagnosis not present

## 2021-12-27 DIAGNOSIS — E785 Hyperlipidemia, unspecified: Secondary | ICD-10-CM | POA: Insufficient documentation

## 2021-12-27 LAB — COMPREHENSIVE METABOLIC PANEL
ALT: 10 U/L (ref 0–35)
AST: 14 U/L (ref 0–37)
Albumin: 4.6 g/dL (ref 3.5–5.2)
Alkaline Phosphatase: 76 U/L (ref 39–117)
BUN: 17 mg/dL (ref 6–23)
CO2: 27 mEq/L (ref 19–32)
Calcium: 9.8 mg/dL (ref 8.4–10.5)
Chloride: 104 mEq/L (ref 96–112)
Creatinine, Ser: 0.81 mg/dL (ref 0.40–1.20)
GFR: 82.93 mL/min (ref >60.00)
Glucose, Bld: 98 mg/dL (ref 70–99)
Potassium: 4.1 mEq/L (ref 3.5–5.1)
Sodium: 139 mEq/L (ref 135–145)
Total Bilirubin: 0.5 mg/dL (ref 0.2–1.2)
Total Protein: 7.8 g/dL (ref 6.0–8.3)

## 2021-12-27 LAB — CBC
HCT: 41 % (ref 36.0–46.0)
Hemoglobin: 13.8 g/dL (ref 12.0–15.0)
MCHC: 33.7 g/dL (ref 30.0–36.0)
MCV: 90.1 fl (ref 78.0–100.0)
Platelets: 202 10*3/uL (ref 150.0–400.0)
RBC: 4.55 Mil/uL (ref 3.87–5.11)
RDW: 13.5 % (ref 11.5–15.5)
WBC: 9.4 10*3/uL (ref 4.0–10.5)

## 2021-12-27 LAB — LIPID PANEL
Cholesterol: 267 mg/dL — ABNORMAL HIGH (ref 0–200)
HDL: 59 mg/dL (ref >39.00)
LDL Cholesterol: 190 mg/dL — ABNORMAL HIGH (ref 0–99)
NonHDL: 208.22
Total CHOL/HDL Ratio: 5
Triglycerides: 91 mg/dL (ref 0.0–149.0)
VLDL: 18.2 mg/dL (ref 0.0–40.0)

## 2021-12-27 LAB — TSH: TSH: 1.84 u[IU]/mL (ref 0.35–5.50)

## 2021-12-27 LAB — T4, FREE: Free T4: 0.68 ng/dL (ref 0.60–1.60)

## 2021-12-28 LAB — FSH/LH
FSH: 139.1 m[IU]/mL — ABNORMAL HIGH
LH: 49.3 m[IU]/mL

## 2021-12-28 LAB — HCV AB W REFLEX TO QUANT PCR: HCV Ab: NONREACTIVE

## 2021-12-28 LAB — HCV INTERPRETATION

## 2022-02-06 ENCOUNTER — Telehealth: Payer: Self-pay | Admitting: Family Medicine

## 2022-02-06 ENCOUNTER — Ambulatory Visit: Payer: 59 | Admitting: Family Medicine

## 2022-02-06 NOTE — Telephone Encounter (Signed)
1st no show, fee waived, letter sent 

## 2022-02-06 NOTE — Telephone Encounter (Signed)
Pt called to cancel 10am OV with Dr. Gena Fray at 8:05. This is her first no show/24 hour cancellation. Letter has been sent out.

## 2022-03-31 ENCOUNTER — Telehealth: Payer: Self-pay

## 2022-03-31 NOTE — Telephone Encounter (Signed)
Patient due for next colon screen Sept. 2031

## 2022-05-30 ENCOUNTER — Other Ambulatory Visit: Payer: Self-pay

## 2022-05-30 ENCOUNTER — Other Ambulatory Visit (HOSPITAL_COMMUNITY): Payer: Self-pay

## 2022-09-21 DIAGNOSIS — N951 Menopausal and female climacteric states: Secondary | ICD-10-CM | POA: Diagnosis not present

## 2022-09-21 DIAGNOSIS — Z01419 Encounter for gynecological examination (general) (routine) without abnormal findings: Secondary | ICD-10-CM | POA: Diagnosis not present

## 2022-09-21 DIAGNOSIS — Z6822 Body mass index (BMI) 22.0-22.9, adult: Secondary | ICD-10-CM | POA: Diagnosis not present

## 2022-09-25 DIAGNOSIS — Z13820 Encounter for screening for osteoporosis: Secondary | ICD-10-CM | POA: Diagnosis not present

## 2022-11-17 ENCOUNTER — Other Ambulatory Visit (HOSPITAL_COMMUNITY): Payer: Self-pay

## 2022-11-17 MED ORDER — ESTRADIOL 0.05 MG/24HR TD PTTW
1.0000 | MEDICATED_PATCH | TRANSDERMAL | 3 refills | Status: AC
  Filled 2022-11-17: qty 24, 84d supply, fill #0
  Filled 2023-02-08: qty 24, 84d supply, fill #1
  Filled 2023-04-30: qty 24, 84d supply, fill #2

## 2022-12-12 DIAGNOSIS — Z1231 Encounter for screening mammogram for malignant neoplasm of breast: Secondary | ICD-10-CM | POA: Diagnosis not present

## 2023-02-09 ENCOUNTER — Other Ambulatory Visit (HOSPITAL_COMMUNITY): Payer: Self-pay

## 2023-05-01 ENCOUNTER — Other Ambulatory Visit: Payer: Self-pay

## 2023-05-01 ENCOUNTER — Other Ambulatory Visit (HOSPITAL_COMMUNITY): Payer: Self-pay

## 2023-05-02 ENCOUNTER — Other Ambulatory Visit (HOSPITAL_COMMUNITY): Payer: Self-pay

## 2023-08-09 ENCOUNTER — Other Ambulatory Visit (HOSPITAL_COMMUNITY): Payer: Self-pay

## 2023-08-09 DIAGNOSIS — L400 Psoriasis vulgaris: Secondary | ICD-10-CM | POA: Diagnosis not present

## 2023-08-09 MED ORDER — CALCIPOTRIENE-BETAMETH DIPROP 0.005-0.064 % EX SUSP: Freq: Every day | CUTANEOUS | 2 refills | Status: AC

## 2023-08-15 ENCOUNTER — Other Ambulatory Visit (HOSPITAL_COMMUNITY): Payer: Self-pay

## 2023-08-15 MED ORDER — BETAMETHASONE DIPROPIONATE 0.05 % EX LOTN
1.0000 | TOPICAL_LOTION | Freq: Two times a day (BID) | CUTANEOUS | 5 refills | Status: AC
  Filled 2023-08-15: qty 60, 30d supply, fill #0

## 2023-08-27 ENCOUNTER — Other Ambulatory Visit (HOSPITAL_COMMUNITY): Payer: Self-pay

## 2023-10-03 ENCOUNTER — Other Ambulatory Visit (HOSPITAL_COMMUNITY): Payer: Self-pay

## 2023-10-03 MED ORDER — ESTRADIOL 0.1 MG/GM VA CREA
0.5000 g | TOPICAL_CREAM | VAGINAL | 3 refills | Status: AC
  Filled 2023-10-03: qty 42.5, 90d supply, fill #0

## 2023-10-05 ENCOUNTER — Other Ambulatory Visit (HOSPITAL_COMMUNITY): Payer: Self-pay

## 2024-01-14 DIAGNOSIS — Z6822 Body mass index (BMI) 22.0-22.9, adult: Secondary | ICD-10-CM | POA: Diagnosis not present

## 2024-01-14 DIAGNOSIS — Z01419 Encounter for gynecological examination (general) (routine) without abnormal findings: Secondary | ICD-10-CM | POA: Diagnosis not present

## 2024-01-14 DIAGNOSIS — Z1231 Encounter for screening mammogram for malignant neoplasm of breast: Secondary | ICD-10-CM | POA: Diagnosis not present

## 2024-01-14 DIAGNOSIS — R6882 Decreased libido: Secondary | ICD-10-CM | POA: Diagnosis not present
